# Patient Record
Sex: Male | Born: 2000 | Race: White | Hispanic: No | Marital: Single | Smoking: Never smoker
Health system: Southern US, Community
[De-identification: ages and names within clinical notes are randomized; demographics above are authoritative.]

## PROBLEM LIST (undated history)

## (undated) DIAGNOSIS — T7840XA Allergy, unspecified, initial encounter: Secondary | ICD-10-CM

## (undated) DIAGNOSIS — J45909 Unspecified asthma, uncomplicated: Secondary | ICD-10-CM

## (undated) HISTORY — DX: Allergy, unspecified, initial encounter: T78.40XA

## (undated) HISTORY — PX: TYMPANOSTOMY TUBE PLACEMENT: SHX32

## (undated) HISTORY — DX: Unspecified asthma, uncomplicated: J45.909

---
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
## 2019-05-07 ENCOUNTER — Other Ambulatory Visit: Payer: Self-pay | Admitting: Family Medicine

## 2019-05-20 ENCOUNTER — Other Ambulatory Visit: Payer: Self-pay | Admitting: Family Medicine

## 2019-06-17 ENCOUNTER — Other Ambulatory Visit: Payer: Self-pay

## 2019-06-17 DIAGNOSIS — Z20822 Contact with and (suspected) exposure to covid-19: Secondary | ICD-10-CM

## 2019-06-18 LAB — NOVEL CORONAVIRUS, NAA: SARS-CoV-2, NAA: NOT DETECTED

## 2019-06-20 ENCOUNTER — Other Ambulatory Visit: Payer: Self-pay

## 2019-06-20 ENCOUNTER — Encounter (HOSPITAL_BASED_OUTPATIENT_CLINIC_OR_DEPARTMENT_OTHER): Payer: Self-pay | Admitting: *Deleted

## 2019-06-20 ENCOUNTER — Ambulatory Visit (INDEPENDENT_AMBULATORY_CARE_PROVIDER_SITE_OTHER): Payer: Self-pay | Admitting: Otolaryngology

## 2019-06-20 DIAGNOSIS — S00431A Contusion of right ear, initial encounter: Secondary | ICD-10-CM

## 2019-06-20 NOTE — H&P (Signed)
PREOPERATIVE H&P  Chief Complaint: Right auricular hematoma  HPI: Alan Burnett is a 18 y.o. male who presents for evaluation of an auricular hematoma. Patient states it first appeared 17 days ago after wrestling and has progressively enlarged since that time. It currently measures about 3 cm in size on the superior pinna and extends anteriorly to the opening of the external ear canal. It is soft and minimally tender to palpation, cystic in appearance. Ear canal and TM are otherwise normal.  Past Medical History:  Diagnosis Date  . Allergy   . Reactive airway disease    Past Surgical History:  Procedure Laterality Date  . TYMPANOSTOMY TUBE PLACEMENT     Social History   Socioeconomic History  . Marital status: Single    Spouse name: Not on file  . Number of children: Not on file  . Years of education: Not on file  . Highest education level: Not on file  Occupational History  . Not on file  Social Needs  . Financial resource strain: Not on file  . Food insecurity    Worry: Not on file    Inability: Not on file  . Transportation needs    Medical: Not on file    Non-medical: Not on file  Tobacco Use  . Smoking status: Never Smoker  . Smokeless tobacco: Never Used  Substance and Sexual Activity  . Alcohol use: Never    Frequency: Never  . Drug use: Never  . Sexual activity: Not on file  Lifestyle  . Physical activity    Days per week: Not on file    Minutes per session: Not on file  . Stress: Not on file  Relationships  . Social Musician on phone: Not on file    Gets together: Not on file    Attends religious service: Not on file    Active member of club or organization: Not on file    Attends meetings of clubs or organizations: Not on file    Relationship status: Not on file  Other Topics Concern  . Not on file  Social History Narrative  . Not on file   History reviewed. No pertinent family history. No Known Allergies Prior to Admission  medications   Medication Sig Start Date End Date Taking? Authorizing Provider  fexofenadine (ALLEGRA) 180 MG tablet Take 1 tablet (180 mg total) by mouth daily. 12/09/14  Yes Merlyn Albert, MD  fluticasone (FLONASE) 50 MCG/ACT nasal spray Place 2 sprays into both nostrils daily. 12/09/14 06/20/19 Yes Merlyn Albert, MD  valACYclovir (VALTREX) 1000 MG tablet TAKE ONE TABLET (1000MG  TOTAL) BY MOUTH THREE TIMES DAILY 05/07/19  Yes 05/09/19, MD     Positive ROS: negative  All other systems have been reviewed and were otherwise negative with the exception of those mentioned in the HPI and as above.  Physical Exam: There were no vitals filed for this visit.  General: Alert, no acute distress Oral: Normal oral mucosa and tonsils Nasal: Clear nasal passages Neck: No palpable adenopathy or thyroid nodules Ear: 3.5 cm oval cystic mass on right superior pinna that extends down to the opening of the external ear canal. Minimal swelling of posterior pinna. Otherwise, bilateral ear canals are clear with normal appearing TMs Cardiovascular: Regular rate and rhythm, no murmur.  Respiratory: Clear to auscultation Neurologic: Alert and oriented x 3   Assessment/Plan: RIGHT CAULIFLOWER EAR Plan for Procedure(s): EXCISION RIGHT CAULIFLOWER EAR CYST   Merlyn Albert,  MD 06/20/2019 3:33 PM

## 2019-06-21 ENCOUNTER — Other Ambulatory Visit (HOSPITAL_COMMUNITY)
Admission: RE | Admit: 2019-06-21 | Discharge: 2019-06-21 | Disposition: A | Discharge status: Home / Self Care | Payer: PRIVATE HEALTH INSURANCE | Source: Ambulatory Visit | Attending: Otolaryngology | Admitting: Otolaryngology

## 2019-06-21 ENCOUNTER — Encounter (HOSPITAL_BASED_OUTPATIENT_CLINIC_OR_DEPARTMENT_OTHER)
Admission: RE | Disposition: A | Discharge status: Home / Self Care | Payer: Self-pay | Source: Home / Self Care | Attending: Otolaryngology

## 2019-06-21 ENCOUNTER — Ambulatory Visit (HOSPITAL_BASED_OUTPATIENT_CLINIC_OR_DEPARTMENT_OTHER): Payer: PRIVATE HEALTH INSURANCE | Admitting: Certified Registered"

## 2019-06-21 ENCOUNTER — Other Ambulatory Visit: Payer: Self-pay

## 2019-06-21 ENCOUNTER — Ambulatory Visit (HOSPITAL_BASED_OUTPATIENT_CLINIC_OR_DEPARTMENT_OTHER)
Admission: RE | Admit: 2019-06-21 | Discharge: 2019-06-21 | Disposition: A | Discharge status: Home / Self Care | Payer: PRIVATE HEALTH INSURANCE | Attending: Otolaryngology | Admitting: Otolaryngology

## 2019-06-21 DIAGNOSIS — Z79899 Other long term (current) drug therapy: Secondary | ICD-10-CM | POA: Diagnosis not present

## 2019-06-21 DIAGNOSIS — J301 Allergic rhinitis due to pollen: Secondary | ICD-10-CM

## 2019-06-21 DIAGNOSIS — Y9372 Activity, wrestling: Secondary | ICD-10-CM | POA: Insufficient documentation

## 2019-06-21 DIAGNOSIS — Z20828 Contact with and (suspected) exposure to other viral communicable diseases: Secondary | ICD-10-CM | POA: Insufficient documentation

## 2019-06-21 DIAGNOSIS — J45909 Unspecified asthma, uncomplicated: Secondary | ICD-10-CM | POA: Insufficient documentation

## 2019-06-21 DIAGNOSIS — S00431A Contusion of right ear, initial encounter: Secondary | ICD-10-CM | POA: Diagnosis not present

## 2019-06-21 HISTORY — PX: EAR CYST EXCISION: SHX22

## 2019-06-21 LAB — SARS CORONAVIRUS 2 (TAT 6-24 HRS): SARS Coronavirus 2: NEGATIVE

## 2019-06-21 SURGERY — EXCISION, CYST, EAR
Anesthesia: General | Site: Ear | Laterality: Right

## 2019-06-21 MED ORDER — FENTANYL CITRATE (PF) 100 MCG/2ML IJ SOLN
INTRAMUSCULAR | Status: DC | PRN
  Administered 2019-06-21: 100 ug via INTRAVENOUS

## 2019-06-21 MED ORDER — CEPHALEXIN 500 MG PO CAPS: 500.0000 mg | ORAL_CAPSULE | Freq: Three times a day (TID) | ORAL | 0 refills | Status: DC

## 2019-06-21 MED ORDER — LIDOCAINE 2% (20 MG/ML) 5 ML SYRINGE
INTRAMUSCULAR | Status: AC
  Filled 2019-06-21: qty 5

## 2019-06-21 MED ORDER — KETOROLAC TROMETHAMINE 30 MG/ML IJ SOLN: 30.0000 mg | Freq: Once | INTRAMUSCULAR | Status: DC | PRN

## 2019-06-21 MED ORDER — PROPOFOL 10 MG/ML IV BOLUS
INTRAVENOUS | Status: AC
  Filled 2019-06-21: qty 20

## 2019-06-21 MED ORDER — CHLORHEXIDINE GLUCONATE CLOTH 2 % EX PADS: 6.0000 | MEDICATED_PAD | Freq: Once | CUTANEOUS | Status: DC

## 2019-06-21 MED ORDER — BACITRACIN 500 UNIT/GM EX OINT
TOPICAL_OINTMENT | CUTANEOUS | Status: DC | PRN
  Administered 2019-06-21: 1 via TOPICAL

## 2019-06-21 MED ORDER — OXYCODONE HCL 5 MG PO TABS: 5.0000 mg | ORAL_TABLET | Freq: Once | ORAL | Status: DC | PRN

## 2019-06-21 MED ORDER — ONDANSETRON HCL 4 MG/2ML IJ SOLN
INTRAMUSCULAR | Status: DC | PRN
  Administered 2019-06-21: 4 mg via INTRAVENOUS

## 2019-06-21 MED ORDER — OXYCODONE HCL 5 MG/5ML PO SOLN: 5.0000 mg | Freq: Once | ORAL | Status: DC | PRN

## 2019-06-21 MED ORDER — DEXMEDETOMIDINE HCL IN NACL 200 MCG/50ML IV SOLN
INTRAVENOUS | Status: AC
  Filled 2019-06-21: qty 50

## 2019-06-21 MED ORDER — GLYCOPYRROLATE 0.2 MG/ML IJ SOLN
0.2000 mg | Freq: Once | INTRAMUSCULAR | Status: AC
  Administered 2019-06-21: 0.2 mg via INTRAVENOUS

## 2019-06-21 MED ORDER — ACETAMINOPHEN 160 MG/5ML PO SOLN: 325.0000 mg | ORAL | Status: DC | PRN

## 2019-06-21 MED ORDER — LACTATED RINGERS IV SOLN
INTRAVENOUS | Status: DC
  Administered 2019-06-21: 15:00:00 via INTRAVENOUS

## 2019-06-21 MED ORDER — MEPERIDINE HCL 25 MG/ML IJ SOLN: 6.2500 mg | INTRAMUSCULAR | Status: DC | PRN

## 2019-06-21 MED ORDER — LIDOCAINE-EPINEPHRINE 1 %-1:100000 IJ SOLN
INTRAMUSCULAR | Status: DC | PRN
  Administered 2019-06-21: 13 mL

## 2019-06-21 MED ORDER — ACETAMINOPHEN 325 MG PO TABS: 325.0000 mg | ORAL_TABLET | ORAL | Status: DC | PRN

## 2019-06-21 MED ORDER — FENTANYL CITRATE (PF) 100 MCG/2ML IJ SOLN: 25.0000 ug | INTRAMUSCULAR | Status: DC | PRN

## 2019-06-21 MED ORDER — GLYCOPYRROLATE 0.2 MG/ML IJ SOLN: 0.1000 mg | Freq: Once | INTRAMUSCULAR | Status: DC

## 2019-06-21 MED ORDER — ONDANSETRON HCL 4 MG/2ML IJ SOLN: 4.0000 mg | Freq: Once | INTRAMUSCULAR | Status: DC | PRN

## 2019-06-21 MED ORDER — PROPOFOL 10 MG/ML IV BOLUS
INTRAVENOUS | Status: AC
  Filled 2019-06-21: qty 40

## 2019-06-21 MED ORDER — ONDANSETRON HCL 4 MG/2ML IJ SOLN
INTRAMUSCULAR | Status: AC
  Filled 2019-06-21: qty 2

## 2019-06-21 MED ORDER — MIDAZOLAM HCL 2 MG/2ML IJ SOLN
INTRAMUSCULAR | Status: AC
  Filled 2019-06-21: qty 2

## 2019-06-21 MED ORDER — CEFAZOLIN SODIUM-DEXTROSE 2-4 GM/100ML-% IV SOLN
INTRAVENOUS | Status: AC
  Filled 2019-06-21: qty 100

## 2019-06-21 MED ORDER — MIDAZOLAM HCL 5 MG/5ML IJ SOLN
INTRAMUSCULAR | Status: DC | PRN
  Administered 2019-06-21: 2 mg via INTRAVENOUS

## 2019-06-21 MED ORDER — CEFAZOLIN SODIUM-DEXTROSE 2-4 GM/100ML-% IV SOLN
2.0000 g | INTRAVENOUS | Status: AC
  Administered 2019-06-21: 2 g via INTRAVENOUS

## 2019-06-21 MED ORDER — FENTANYL CITRATE (PF) 100 MCG/2ML IJ SOLN
INTRAMUSCULAR | Status: AC
  Filled 2019-06-21: qty 2

## 2019-06-21 SURGICAL SUPPLY — 51 items
APL SKNCLS STERI-STRIP NONHPOA (GAUZE/BANDAGES/DRESSINGS)
BALL CTTN LRG ABS STRL LF (GAUZE/BANDAGES/DRESSINGS) ×1
BENZOIN TINCTURE PRP APPL 2/3 (GAUZE/BANDAGES/DRESSINGS) IMPLANT
BLADE CLIPPER SURG (BLADE) IMPLANT
BLADE EAR TYMPAN 2.5 60D BEAV (BLADE) ×1 IMPLANT
BLADE EYE SICKLE 84 5 BEAV (BLADE) IMPLANT
BLADE EYE SICKLE 84 5MM BEAV (BLADE)
BLADE NDL 3 SS STRL (BLADE) IMPLANT
BLADE NEEDLE 3 SS STRL (BLADE) IMPLANT
BLADE NEEDLE 3MM SS STRL (BLADE)
CANISTER SUCT 1200ML W/VALVE (MISCELLANEOUS) ×3 IMPLANT
COTTONBALL LRG STERILE PKG (GAUZE/BANDAGES/DRESSINGS) ×3 IMPLANT
COVER WAND RF STERILE (DRAPES) IMPLANT
DECANTER SPIKE VIAL GLASS SM (MISCELLANEOUS) ×3 IMPLANT
DEPRESSOR TONGUE BLADE STERILE (MISCELLANEOUS) IMPLANT
DRAPE EENT ADH APERT 31X51 STR (DRAPES) ×1 IMPLANT
DRAPE HALF SHEET 70X43 (DRAPES) IMPLANT
DRAPE MICROSCOPE URBAN (DRAPES) IMPLANT
DRAPE SURG 17X23 STRL (DRAPES) IMPLANT
DRSG CURAD 3X16 NADH (PACKING) IMPLANT
DRSG GLASSCOCK MASTOID ADT (GAUZE/BANDAGES/DRESSINGS) IMPLANT
DRSG GLASSCOCK MASTOID PED (GAUZE/BANDAGES/DRESSINGS) IMPLANT
ELECT COATED BLADE 2.86 ST (ELECTRODE) IMPLANT
ELECT REM PT RETURN 9FT ADLT (ELECTROSURGICAL)
ELECTRODE REM PT RTRN 9FT ADLT (ELECTROSURGICAL) IMPLANT
GAUZE SPONGE 4X4 12PLY STRL LF (GAUZE/BANDAGES/DRESSINGS) IMPLANT
GLOVE SS BIOGEL STRL SZ 7.5 (GLOVE) ×1 IMPLANT
GLOVE SUPERSENSE BIOGEL SZ 7.5 (GLOVE) ×2
GOWN STRL REUS W/ TWL LRG LVL3 (GOWN DISPOSABLE) ×1 IMPLANT
GOWN STRL REUS W/ TWL XL LVL3 (GOWN DISPOSABLE) ×1 IMPLANT
GOWN STRL REUS W/TWL LRG LVL3 (GOWN DISPOSABLE) ×3
GOWN STRL REUS W/TWL XL LVL3 (GOWN DISPOSABLE) ×3
IV CATH AUTO 14GX1.75 SAFE ORG (IV SOLUTION) IMPLANT
IV NS 500ML (IV SOLUTION)
IV NS 500ML BAXH (IV SOLUTION) IMPLANT
IV SET EXT 30 76VOL 4 MALE LL (IV SETS) ×3 IMPLANT
NDL PRECISIONGLIDE 27X1.5 (NEEDLE) ×1 IMPLANT
NDL SAFETY ECLIPSE 18X1.5 (NEEDLE) IMPLANT
NEEDLE HYPO 18GX1.5 SHARP (NEEDLE)
NEEDLE PRECISIONGLIDE 27X1.5 (NEEDLE) ×3 IMPLANT
NS IRRIG 1000ML POUR BTL (IV SOLUTION) ×1 IMPLANT
PACK BASIN DAY SURGERY FS (CUSTOM PROCEDURE TRAY) ×3 IMPLANT
PACK ENT DAY SURGERY (CUSTOM PROCEDURE TRAY) ×3 IMPLANT
PENCIL BUTTON HOLSTER BLD 10FT (ELECTRODE) ×2 IMPLANT
SLEEVE SCD COMPRESS KNEE MED (MISCELLANEOUS) ×3 IMPLANT
SPONGE SURGIFOAM ABS GEL 12-7 (HEMOSTASIS) IMPLANT
SUT ETHILON 3 0 PS 1 (SUTURE) ×4 IMPLANT
SYR 3ML 18GX1 1/2 (SYRINGE) IMPLANT
TOWEL GREEN STERILE FF (TOWEL DISPOSABLE) ×6 IMPLANT
TRAY DSU PREP LF (CUSTOM PROCEDURE TRAY) ×3 IMPLANT
TUBING IRRIGATION (MISCELLANEOUS) IMPLANT

## 2019-06-21 NOTE — Discharge Instructions (Signed)
°  Post Anesthesia Home Care Instructions  Activity: Get plenty of rest for the remainder of the day. A responsible individual must stay with you for 24 hours following the procedure.  For the next 24 hours, DO NOT: -Drive a car -Paediatric nurse -Drink alcoholic beverages -Take any medication unless instructed by your physician -Make any legal decisions or sign important papers.  Meals: Start with liquid foods such as gelatin or soup. Progress to regular foods as tolerated. Avoid greasy, spicy, heavy foods. If nausea and/or vomiting occur, drink only clear liquids until the nausea and/or vomiting subsides. Call your physician if vomiting continues.  Special Instructions/Symptoms: Your throat may feel dry or sore from the anesthesia or the breathing tube placed in your throat during surgery. If this causes discomfort, gargle with warm salt water. The discomfort should disappear within 24 hours.  If you had a scopolamine patch placed behind your ear for the management of post- operative nausea and/or vomiting:  1. The medication in the patch is effective for 72 hours, after which it should be removed.  Wrap patch in a tissue and discard in the trash. Wash hands thoroughly with soap and water. 2. You may remove the patch earlier than 72 hours if you experience unpleasant side effects which may include dry mouth, dizziness or visual disturbances. 3. Avoid touching the patch. Wash your hands with soap and water after contact with the patch.        Call your surgeon if you experience:   1.  Fever over 101.0. 2.  Inability to urinate. 3.  Nausea and/or vomiting. 4.  Extreme swelling or bruising at the surgical site. 5.  Continued bleeding from the incision. 6.  Increased pain, redness or drainage from the incision. 7.  Problems related to your pain medication. 8.  Any problems and/or concerns    Remove head ear dressing tomorrow . Tylenol or ibuprofen prn pain Take Keflex 500 mg  tid for the next week Return to office next Thursday at 4:30

## 2019-06-21 NOTE — Transfer of Care (Signed)
Immediate Anesthesia Transfer of Care Note  Patient: Alan Burnett  Procedure(s) Performed: EXCISION RIGHT CAULIFLOWER EAR CYST (Right Ear)  Patient Location: PACU  Anesthesia Type:MAC  Level of Consciousness: awake, alert  and oriented  Airway & Oxygen Therapy: Patient Spontanous Breathing  Post-op Assessment: Report given to RN and Post -op Vital signs reviewed and stable  Post vital signs: Reviewed and stable  Last Vitals:  Vitals Value Taken Time  BP    Temp    Pulse    Resp    SpO2      Last Pain:  Vitals:   06/21/19 1447  TempSrc: Oral  PainSc: 0-No pain         Complications: No apparent anesthesia complications

## 2019-06-21 NOTE — Interval H&P Note (Signed)
History and Physical Interval Note:  06/21/2019 2:35 PM  Alan Burnett  has presented today for surgery, with the diagnosis of RIGHT CAULIFLOWER EAR.  The various methods of treatment have been discussed with the patient and family. After consideration of risks, benefits and other options for treatment, the patient has consented to  Procedure(s): EXCISION RIGHT CAULIFLOWER EAR CYST (Right) as a surgical intervention.  The patient's history has been reviewed, patient examined, no change in status, stable for surgery.  I have reviewed the patient's chart and labs.  Questions were answered to the patient's satisfaction.     Melony Overly

## 2019-06-21 NOTE — Brief Op Note (Signed)
06/21/2019  4:10 PM  PATIENT:  Alan Burnett  18 y.o. male  PRE-OPERATIVE DIAGNOSIS:  RIGHT CAULIFLOWER EAR  POST-OPERATIVE DIAGNOSIS:  RIGHT CAULIFLOWER EAR  PROCEDURE:  Procedure(s): EXCISION RIGHT CAULIFLOWER EAR CYST (Right)Right ear hematoma  SURGEON:  Surgeon(s) and Role:    Rozetta Nunnery, MD - Primary  PHYSICIAN ASSISTANT:   ASSISTANTS: none   ANESTHESIA:   IV sedation  EBL:  15 mL   BLOOD ADMINISTERED:none  DRAINS: none   LOCAL MEDICATIONS USED:  XYLOCAINE with epi 9 cc  SPECIMEN:  No Specimen  DISPOSITION OF SPECIMEN:  N/A  COUNTS:  YES  TOURNIQUET:  * No tourniquets in log *  DICTATION: .Other Dictation: Dictation Number [id number]  PLAN OF CARE: Discharge to home after PACU  PATIENT DISPOSITION:  PACU - hemodynamically stable.   Delay start of Pharmacological VTE agent (>24hrs) due to surgical blood loss or risk of bleeding: yes

## 2019-06-21 NOTE — Anesthesia Preprocedure Evaluation (Addendum)
Anesthesia Evaluation  Patient identified by MRN, date of birth, ID band Patient awake    Reviewed: Allergy & Precautions, NPO status , Patient's Chart, lab work & pertinent test results  Airway Mallampati: I       Dental no notable dental hx. (+) Teeth Intact   Pulmonary neg pulmonary ROS,    Pulmonary exam normal breath sounds clear to auscultation       Cardiovascular negative cardio ROS Normal cardiovascular exam Rhythm:Regular Rate:Normal     Neuro/Psych negative neurological ROS     GI/Hepatic negative GI ROS, Neg liver ROS,   Endo/Other  negative endocrine ROS  Renal/GU negative Renal ROS  negative genitourinary   Musculoskeletal negative musculoskeletal ROS (+)   Abdominal Normal abdominal exam  (+)   Peds  Hematology negative hematology ROS (+)   Anesthesia Other Findings   Reproductive/Obstetrics                             Anesthesia Physical Anesthesia Plan  ASA: I  Anesthesia Plan: MAC   Post-op Pain Management:    Induction:   PONV Risk Score and Plan: 3 and Ondansetron, Dexamethasone and Midazolam  Airway Management Planned: Nasal Cannula and Natural Airway  Additional Equipment: None  Intra-op Plan:   Post-operative Plan:   Informed Consent: I have reviewed the patients History and Physical, chart, labs and discussed the procedure including the risks, benefits and alternatives for the proposed anesthesia with the patient or authorized representative who has indicated his/her understanding and acceptance.       Plan Discussed with: CRNA  Anesthesia Plan Comments:        Anesthesia Quick Evaluation

## 2019-06-22 NOTE — Op Note (Signed)
NAME: Alan Burnett, Alan Burnett MEDICAL RECORD [id number] ACCOUNT [id number] DATE OF BIRTH:April 28, 2001 FACILITY: [facility] LOCATION: [facility] PHYSICIAN:CHRISTOPHER Lincoln Maxin, MD  OPERATIVE REPORT  DATE OF PROCEDURE:  06/21/2019  PREOPERATIVE DIAGNOSIS:  Right ear hematoma/cyst.  POSTOPERATIVE DIAGNOSIS:  Right ear hematoma/cyst.  OPERATION PERFORMED:  Drainage and stenting right ear hematemesis.  SURGEON:  Melony Overly, MD  ANESTHESIA:  MAC with 9 mL of Xylocaine with epinephrine for local anesthetic.  COMPLICATIONS:  None.  BRIEF CLINICAL NOTE:  [name] is an 18 year old who had a large right hematoma of the auricle involving the superior half of his auricle.  It extended down to the conchal area.  He thought this might resolve, but has been persistent now for about 2  weeks.  It is soft to palpation.  He is taken to the operating room at this time for drainage of the auricle hematemesis and stenting the ear following drainage.  DESCRIPTION OF PROCEDURE:  The patient received some IV sedation.  The ear was prepped with Betadine solution.  The ear was then elliptically injected with Xylocaine with epinephrine for local anesthetic.  He was prepped with Betadine solution and draped  in sterile towels.  An incision was made at the inferior aspect of the large hematoma and a large amount of mostly cystic serous fluid was expressed.  The cartilage was reasonably intact.  After draining the cyst hematoma, the cyst was irrigated with  Xylocaine with epinephrine.  Then, 2 dental rolls were secured to the auricle anteriorly so as to keep the cyst compressed and secured with 3-0 silk sutures.  After securing the 2 dental rolls, the area was then further packed with some cotton ball  soaked in mupirocin and a Kling roll was placed around the forehead and the ear to keep compression on the ear for the next 24 hours.  The patient tolerated this well.  The patient was subsequently discharged home  later this morning on Keflex 500 mg  t.i.d. for 1 week.  He will follow up in my office in 1 week to have the dental rolls removed and reexamine the ear.  TN/NUANCE  D:06/21/2019 T:06/22/2019 JOB:[id number]

## 2019-06-24 ENCOUNTER — Encounter (HOSPITAL_BASED_OUTPATIENT_CLINIC_OR_DEPARTMENT_OTHER): Payer: Self-pay | Admitting: Otolaryngology

## 2019-06-27 NOTE — Anesthesia Postprocedure Evaluation (Signed)
Anesthesia Post Note  Patient: Alan Burnett  Procedure(s) Performed: EXCISION RIGHT CAULIFLOWER EAR CYST (Right Ear)     Patient location during evaluation: PACU Anesthesia Type: MAC Level of consciousness: awake Pain management: pain level controlled Vital Signs Assessment: post-procedure vital signs reviewed and stable Respiratory status: spontaneous breathing Cardiovascular status: stable Postop Assessment: no apparent nausea or vomiting Anesthetic complications: no    Last Vitals:  Vitals:   06/21/19 1615 06/21/19 1627  BP: (!) 104/55 (!) 104/55  Pulse: 71 65  Resp:    Temp: 37 C   SpO2: 100% 100%    Last Pain:  Vitals:   06/21/19 1627  TempSrc:   PainSc: 0-No pain   Pain Goal:                   Huston Foley

## 2019-07-01 ENCOUNTER — Other Ambulatory Visit: Payer: Self-pay

## 2019-07-01 DIAGNOSIS — Z20822 Contact with and (suspected) exposure to covid-19: Secondary | ICD-10-CM

## 2019-07-02 ENCOUNTER — Ambulatory Visit (INDEPENDENT_AMBULATORY_CARE_PROVIDER_SITE_OTHER): Payer: PRIVATE HEALTH INSURANCE | Admitting: Family Medicine

## 2019-07-02 ENCOUNTER — Other Ambulatory Visit: Payer: Self-pay

## 2019-07-02 DIAGNOSIS — B9689 Other specified bacterial agents as the cause of diseases classified elsewhere: Secondary | ICD-10-CM | POA: Diagnosis not present

## 2019-07-02 DIAGNOSIS — J019 Acute sinusitis, unspecified: Secondary | ICD-10-CM

## 2019-07-02 LAB — NOVEL CORONAVIRUS, NAA: SARS-CoV-2, NAA: NOT DETECTED

## 2019-07-02 MED ORDER — CEFPROZIL 500 MG PO TABS: 500.0000 mg | ORAL_TABLET | Freq: Two times a day (BID) | ORAL | 0 refills | Status: DC

## 2019-07-02 NOTE — Progress Notes (Signed)
   Subjective:  Audio plus video  Patient ID: Alan Burnett, male    DOB: 04-30-2001, 18 y.o.   MRN: [id number]  Sore Throat  This is a new problem. The current episode started in the past 7 days. Associated symptoms comments: Fever, fatigue. Treatments tried: otc meds.    Had Covid test Monday and waiting results   Review of Systems Virtual Visit via Video Note  I connected with RAFI KENNETH on 07/02/19 at  3:00 PM EST by a video enabled telemedicine application and verified that I am speaking with the correct person using two identifiers.  Location: Patient: home Provider: office   I discussed the limitations of evaluation and management by telemedicine and the availability of in person appointments. The patient expressed understanding and agreed to proceed.  History of Present Illness:    Observations/Objective:   Assessment and Plan:   Follow Up Instructions:    I discussed the assessment and treatment plan with the patient. The patient was provided an opportunity to ask questions and all were answered. The patient agreed with the plan and demonstrated an understanding of the instructions.   The patient was advised to call back or seek an in-person evaluation if the symptoms worsen or if the condition fails to improve as anticipated.  I provided 18 minutes of non-face-to-face time during this encounter.  Patient has had sinus congestion.  Productive of phlegm.  Diminished energy.  A bit achy.  Has had some cough  Productive at times.  In some ways similar to his prior sinus infections  No vomiting no diarrhea no rash      Objective:   Physical Exam Virtual       Assessment & Plan:  Impression potential rhinosinusitis/bronchitis.  Antibiotics prescribed.  Symptom care discussed warning signs discussed.  Covid testing done which is a good idea.  Pending Covid test returned negative

## 2019-07-07 ENCOUNTER — Encounter: Payer: Self-pay | Admitting: Family Medicine

## 2019-08-27 ENCOUNTER — Ambulatory Visit: Payer: PRIVATE HEALTH INSURANCE | Attending: Internal Medicine

## 2019-08-27 ENCOUNTER — Other Ambulatory Visit: Payer: Self-pay

## 2019-08-27 DIAGNOSIS — Z20822 Contact with and (suspected) exposure to covid-19: Secondary | ICD-10-CM

## 2019-08-28 LAB — NOVEL CORONAVIRUS, NAA: SARS-CoV-2, NAA: NOT DETECTED

## 2019-09-18 ENCOUNTER — Encounter: Payer: Self-pay | Admitting: Family Medicine

## 2019-09-19 ENCOUNTER — Encounter: Payer: Self-pay | Admitting: Family Medicine

## 2019-10-14 ENCOUNTER — Other Ambulatory Visit: Payer: Self-pay

## 2019-10-14 ENCOUNTER — Ambulatory Visit: Payer: PRIVATE HEALTH INSURANCE | Attending: Internal Medicine

## 2019-10-14 DIAGNOSIS — Z20822 Contact with and (suspected) exposure to covid-19: Secondary | ICD-10-CM

## 2019-10-15 LAB — NOVEL CORONAVIRUS, NAA: SARS-CoV-2, NAA: DETECTED — AB

## 2020-02-05 ENCOUNTER — Ambulatory Visit (INDEPENDENT_AMBULATORY_CARE_PROVIDER_SITE_OTHER): Payer: PRIVATE HEALTH INSURANCE | Admitting: Nurse Practitioner

## 2020-02-05 ENCOUNTER — Other Ambulatory Visit: Payer: Self-pay

## 2020-02-05 ENCOUNTER — Encounter: Payer: Self-pay | Admitting: Nurse Practitioner

## 2020-02-05 VITALS — BP 116/74 | Temp 97.7°F | Ht 67.5 in | Wt 159.0 lb

## 2020-02-05 DIAGNOSIS — J310 Chronic rhinitis: Secondary | ICD-10-CM

## 2020-02-05 DIAGNOSIS — L237 Allergic contact dermatitis due to plants, except food: Secondary | ICD-10-CM | POA: Diagnosis not present

## 2020-02-05 MED ORDER — PREDNISONE 20 MG PO TABS: ORAL_TABLET | ORAL | 0 refills | Status: AC

## 2020-02-05 NOTE — Patient Instructions (Signed)
Pepcid once daily

## 2020-02-05 NOTE — Progress Notes (Signed)
   Subjective:    Patient ID: Alan Burnett, male    DOB: 2000/12/31, 19 y.o.   MRN: [id number]  HPI itchy rash all over. Came up 2 days ago. Tried calamine lotion.  Exposed to poison oak when hiking last week.   Sinus symptoms. Congestion, cough, headache. Started 2 days ago. Tried allergy med mucinex and tylenol.  All of his symptoms began after going hiking last week.  Has a history of poison oak allergy.  Mild headache.  Sore throat improved.  Missed taking his allergy pills while on his trip.  No fever.  Clear drainage.  Ear pressure.  Has developed a rash in various areas of the body that is very pruritic and similar to his poison oak rash in the past.  No wheezing.  No difficulty breathing or swallowing.   Review of Systems     Objective:   Physical Exam NAD.  Alert, oriented.  TMs mild clear effusion, no erythema.  Pharynx mildly injected, otherwise clear.  Neck supple with minimal anterior cervical adenopathy.  Lungs clear.  Heart regular rate rhythm.  Patient has several discrete multiple patches of erythematous rash with a few mildly raised papules.  A linear rash is noted on the right thigh.  A larger patch is noted on the left thigh.  Also has a patch on the right mid flank area.       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 predniSONE (DELTASONE) 20 MG tablet    Sig: 3 po qd x 3 d then 2 po qd x 3 d then 1 po qd x 2 d    Dispense:  17 tablet    Refill:  0    Order Specific Question:   Supervising Provider    Answer:   Lilyan Punt A [[id number]]   Restart Allegra and Flonase as directed.  Continue calamine lotion and hydrocortisone cream OTC as directed.  Warning signs reviewed.  Call back if symptoms worsen or persist.

## 2020-02-07 ENCOUNTER — Encounter: Payer: Self-pay | Admitting: Nurse Practitioner

## 2021-02-18 ENCOUNTER — Other Ambulatory Visit: Payer: Self-pay | Admitting: Family Medicine

## 2023-02-28 DIAGNOSIS — B009 Herpesviral infection, unspecified: Secondary | ICD-10-CM | POA: Diagnosis not present

## 2023-04-07 DIAGNOSIS — R413 Other amnesia: Secondary | ICD-10-CM | POA: Diagnosis not present

## 2023-04-07 DIAGNOSIS — S060X0A Concussion without loss of consciousness, initial encounter: Secondary | ICD-10-CM | POA: Diagnosis not present

## 2023-04-07 DIAGNOSIS — R4182 Altered mental status, unspecified: Secondary | ICD-10-CM | POA: Diagnosis not present

## 2023-04-07 DIAGNOSIS — M7989 Other specified soft tissue disorders: Secondary | ICD-10-CM | POA: Diagnosis not present

## 2023-04-07 DIAGNOSIS — S060XAA Concussion with loss of consciousness status unknown, initial encounter: Secondary | ICD-10-CM | POA: Diagnosis not present

## 2023-04-07 DIAGNOSIS — Y92410 Unspecified street and highway as the place of occurrence of the external cause: Secondary | ICD-10-CM | POA: Diagnosis not present

## 2023-04-07 DIAGNOSIS — M25522 Pain in left elbow: Secondary | ICD-10-CM | POA: Diagnosis not present

## 2023-04-07 DIAGNOSIS — S0990XA Unspecified injury of head, initial encounter: Secondary | ICD-10-CM | POA: Diagnosis not present
